# Patient Record
Sex: Female | Born: 2000 | Race: White | Hispanic: No | Marital: Single | Smoking: Never smoker
Health system: Southern US, Community
[De-identification: ages and names within clinical notes are randomized; demographics above are authoritative.]

## PROBLEM LIST (undated history)

## (undated) DIAGNOSIS — F419 Anxiety disorder, unspecified: Secondary | ICD-10-CM

## (undated) DIAGNOSIS — R569 Unspecified convulsions: Secondary | ICD-10-CM

## (undated) DIAGNOSIS — F988 Other specified behavioral and emotional disorders with onset usually occurring in childhood and adolescence: Secondary | ICD-10-CM

---
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
## 2021-08-07 ENCOUNTER — Emergency Department (HOSPITAL_COMMUNITY)
Admission: EM | Admit: 2021-08-07 | Discharge: 2021-08-07 | Disposition: A | Discharge status: Home / Self Care | Payer: BC Managed Care – PPO | Attending: Emergency Medicine | Admitting: Emergency Medicine

## 2021-08-07 ENCOUNTER — Other Ambulatory Visit: Payer: Self-pay

## 2021-08-07 ENCOUNTER — Encounter (HOSPITAL_COMMUNITY): Payer: Self-pay | Admitting: Emergency Medicine

## 2021-08-07 DIAGNOSIS — Z5321 Procedure and treatment not carried out due to patient leaving prior to being seen by health care provider: Secondary | ICD-10-CM | POA: Diagnosis not present

## 2021-08-07 DIAGNOSIS — R569 Unspecified convulsions: Secondary | ICD-10-CM | POA: Insufficient documentation

## 2021-08-07 HISTORY — DX: Unspecified convulsions: R56.9

## 2021-08-07 NOTE — ED Notes (Signed)
Stated she did not want to wait, pt left ama
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
## 2021-12-03 ENCOUNTER — Encounter (HOSPITAL_BASED_OUTPATIENT_CLINIC_OR_DEPARTMENT_OTHER): Payer: Self-pay | Admitting: *Deleted

## 2021-12-03 ENCOUNTER — Other Ambulatory Visit: Payer: Self-pay

## 2021-12-03 ENCOUNTER — Emergency Department (HOSPITAL_BASED_OUTPATIENT_CLINIC_OR_DEPARTMENT_OTHER)
Admission: EM | Admit: 2021-12-03 | Discharge: 2021-12-03 | Disposition: A | Discharge status: Home / Self Care | Payer: BC Managed Care – PPO | Attending: Student | Admitting: Student

## 2021-12-03 ENCOUNTER — Emergency Department (HOSPITAL_BASED_OUTPATIENT_CLINIC_OR_DEPARTMENT_OTHER): Payer: BC Managed Care – PPO

## 2021-12-03 DIAGNOSIS — Y92410 Unspecified street and highway as the place of occurrence of the external cause: Secondary | ICD-10-CM | POA: Insufficient documentation

## 2021-12-03 DIAGNOSIS — M25512 Pain in left shoulder: Secondary | ICD-10-CM | POA: Diagnosis not present

## 2021-12-03 DIAGNOSIS — R519 Headache, unspecified: Secondary | ICD-10-CM | POA: Insufficient documentation

## 2021-12-03 MED ORDER — NAPROXEN 375 MG PO TABS: 375.0000 mg | ORAL_TABLET | Freq: Two times a day (BID) | ORAL | 0 refills | Status: AC

## 2021-12-03 NOTE — ED Notes (Signed)
Pt verbalized understanding to pick up prescriptions at pharmacy listed on d/c instructions.  °

## 2021-12-03 NOTE — Discharge Instructions (Signed)
The xrays did not show any serious injuries.  Expect to be stiff and sore for the next several days.  Apply ice to help with swelling and pain

## 2021-12-03 NOTE — ED Notes (Signed)
ED Provider at bedside. 
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
## 2022-11-16 ENCOUNTER — Other Ambulatory Visit: Payer: Self-pay

## 2022-11-16 ENCOUNTER — Emergency Department (HOSPITAL_BASED_OUTPATIENT_CLINIC_OR_DEPARTMENT_OTHER): Payer: BC Managed Care – PPO

## 2022-11-16 ENCOUNTER — Encounter (HOSPITAL_BASED_OUTPATIENT_CLINIC_OR_DEPARTMENT_OTHER): Payer: Self-pay

## 2022-11-16 ENCOUNTER — Emergency Department (HOSPITAL_BASED_OUTPATIENT_CLINIC_OR_DEPARTMENT_OTHER)
Admission: EM | Admit: 2022-11-16 | Discharge: 2022-11-16 | Disposition: A | Discharge status: Home / Self Care | Payer: BC Managed Care – PPO | Attending: Emergency Medicine | Admitting: Emergency Medicine

## 2022-11-16 DIAGNOSIS — R0789 Other chest pain: Secondary | ICD-10-CM | POA: Diagnosis not present

## 2022-11-16 DIAGNOSIS — R079 Chest pain, unspecified: Secondary | ICD-10-CM | POA: Diagnosis present

## 2022-11-16 DIAGNOSIS — Y92410 Unspecified street and highway as the place of occurrence of the external cause: Secondary | ICD-10-CM | POA: Diagnosis not present
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
